# Patient Record
Sex: Female | Born: 1985 | Race: White | Hispanic: No | Marital: Married | Smoking: Never smoker
Health system: Southern US, Community
[De-identification: ages and names within clinical notes are randomized; demographics above are authoritative.]

## PROBLEM LIST (undated history)

## (undated) DIAGNOSIS — J45909 Unspecified asthma, uncomplicated: Secondary | ICD-10-CM

## (undated) HISTORY — DX: Unspecified asthma, uncomplicated: J45.909

---

## 2014-12-25 ENCOUNTER — Ambulatory Visit (INDEPENDENT_AMBULATORY_CARE_PROVIDER_SITE_OTHER): Payer: 59

## 2014-12-25 ENCOUNTER — Ambulatory Visit (INDEPENDENT_AMBULATORY_CARE_PROVIDER_SITE_OTHER): Payer: 59 | Admitting: Podiatrist

## 2014-12-25 ENCOUNTER — Encounter: Payer: Self-pay | Admitting: Podiatrist

## 2014-12-25 DIAGNOSIS — B079 Viral wart, unspecified: Secondary | ICD-10-CM

## 2014-12-25 DIAGNOSIS — R52 Pain, unspecified: Secondary | ICD-10-CM

## 2014-12-25 DIAGNOSIS — M722 Plantar fascial fibromatosis: Secondary | ICD-10-CM | POA: Diagnosis not present

## 2014-12-25 MED ORDER — CIMETIDINE 800 MG PO TABS: 800.0000 mg | ORAL_TABLET | Freq: Every day | ORAL | Status: DC

## 2014-12-25 NOTE — Patient Instructions (Signed)
Plantar Fasciitis (Heel Spur Syndrome) with Rehab The plantar fascia is a fibrous, ligament-like, soft-tissue structure that spans the bottom of the foot. Plantar fasciitis is a condition that causes pain in the foot due to inflammation of the tissue. SYMPTOMS   Pain and tenderness on the underneath side of the foot.  Pain that worsens with standing or walking. CAUSES  Plantar fasciitis is caused by irritation and injury to the plantar fascia on the underneath side of the foot. Common mechanisms of injury include:  Direct trauma to bottom of the foot.  Damage to a small nerve that runs under the foot where the main fascia attaches to the heel bone. Stress placed on the plantar fascia due to any mild increased activity or injury RISK INCREASES WITH:   Obesity.  Poor strength and flexibility.  Improperly fitted shoes.  Tight calf muscles.  Flat feet.  Failure to warm-up properly before activity.  PREVENTION  Warm up and stretch properly before activity.  Strength, flexibility  Maintain a health body weight.  Avoid stress on the plantar fascia.  Wear properly fitted shoes, including arch supports for individuals who have flat feet. PROGNOSIS  If treated properly, then the symptoms of plantar fasciitis usually resolve without surgery. However, occasionally surgery is necessary. RELATED COMPLICATIONS   Recurrent symptoms that may result in a chronic condition.  Problems of the lower back that are caused by compensating for the injury, such as limping.  Pain or weakness of the foot during push-off following surgery.  Chronic inflammation, scarring, and partial or complete fascia tear, occurring more often from repeated injections. TREATMENT  Treatment initially involves the use of ice and medication to help reduce pain and inflammation. The use of strengthening and stretching exercises may help reduce pain with activity, especially stretches of the Achilles tendon.  Your  caregiver may recommend that you use arch supports to help reduce stress on the plantar fascia. Often, corticosteroid injections are given to reduce inflammation. If symptoms persist for greater than 6 months despite non-surgical (conservative), then surgery may be recommended.  MEDICATION   If pain medication is necessary, then nonsteroidal anti-inflammatory medications, such as aspirin and ibuprofen, or other minor pain relievers, such as acetaminophen, are often recommended. Corticosteroid injections may be given by your caregiver.  HEAT AND COLD  Cold treatment (icing) relieves pain and reduces inflammation. Cold treatment should be applied for 10 to 15 minutes every 2 to 3 hours for inflammation and pain and immediately after any activity that aggravates your symptoms. Use ice packs or massage the area with a piece of ice (ice massage).  Heat treatment may be used prior to performing the stretching and strengthening activities prescribed by your caregiver, physical therapist, or athletic trainer. Use a heat pack or soak the injury in warm water. SEEK IMMEDIATE MEDICAL CARE IF:  Treatment seems to offer no benefit, or the condition worsens.  Any medications produce adverse side effects.  Perform this particular stretch daily first thing in the morning and before you go to bed. Hold for 30 seconds.    Try all the exercises and choose your favorite 3 to perform daily--  EXERCISES-- perform each exercise a total of 10-15 repetitions.  Hold for 30 seconds and perform 3 times per day   RANGE OF MOTION (ROM) AND STRETCHING EXERCISES - Plantar Fasciitis (Heel Spur Syndrome) These exercises may help you when beginning to rehabilitate your injury.   While completing these exercises, remember:   Restoring tissue flexibility helps normal  motion to return to the joints. This allows healthier, less painful movement and activity.  An effective stretch should be held for at least 30 seconds.  A  stretch should never be painful. You should only feel a gentle lengthening or release in the stretched tissue. RANGE OF MOTION - Toe Extension, Flexion  Sit with your right / left leg crossed over your opposite knee.  Grasp your toes and gently pull them back toward the top of your foot. You should feel a stretch on the bottom of your toes and/or foot.  Hold this stretch for __________ seconds.  Now, gently pull your toes toward the bottom of your foot. You should feel a stretch on the top of your toes and or foot.  Hold this stretch for __________ seconds. Repeat __________ times. Complete this stretch __________ times per day.  RANGE OF MOTION - Ankle Dorsiflexion, Active Assisted  Remove shoes and sit on a chair that is preferably not on a carpeted surface.  Place right / left foot under knee. Extend your opposite leg for support.  Keeping your heel down, slide your right / left foot back toward the chair until you feel a stretch at your ankle or calf. If you do not feel a stretch, slide your bottom forward to the edge of the chair, while still keeping your heel down.  Hold this stretch for __________ seconds. Repeat __________ times. Complete this stretch __________ times per day.  STRETCH  Gastroc, Standing  Place hands on wall.  Extend right / left leg, keeping the front knee somewhat bent.  Slightly point your toes inward on your back foot.  Keeping your right / left heel on the floor and your knee straight, shift your weight toward the wall, not allowing your back to arch.  You should feel a gentle stretch in the right / left calf. Hold this position for __________ seconds. Repeat __________ times. Complete this stretch __________ times per day. STRETCH  Soleus, Standing  Place hands on wall.  Extend right / left leg, keeping the other knee somewhat bent.  Slightly point your toes inward on your back foot.  Keep your right / left heel on the floor, bend your back  knee, and slightly shift your weight over the back leg so that you feel a gentle stretch deep in your back calf.  Hold this position for __________ seconds. Repeat __________ times. Complete this stretch __________ times per day. STRETCH  Gastrocsoleus, Standing  Note: This exercise can place a lot of stress on your foot and ankle. Please complete this exercise only if specifically instructed by your caregiver.   Place the ball of your right / left foot on a step, keeping your other foot firmly on the same step.  Hold on to the wall or a rail for balance.  Slowly lift your other foot, allowing your body weight to press your heel down over the edge of the step.  You should feel a stretch in your right / left calf.  Hold this position for __________ seconds.  Repeat this exercise with a slight bend in your right / left knee. Repeat __________ times. Complete this stretch __________ times per day.  STRENGTHENING EXERCISES - Plantar Fasciitis (Heel Spur Syndrome)  These exercises may help you when beginning to rehabilitate your injury. They may resolve your symptoms with or without further involvement from your physician, physical therapist or athletic trainer. While completing these exercises, remember:   Muscles can gain both the endurance  and the strength needed for everyday activities through controlled exercises.  Complete these exercises as instructed by your physician, physical therapist or athletic trainer. Progress the resistance and repetitions only as guided.   Plantar Warts Warts are benign (noncancerous) growths of the outer skin layer. They can occur at any time in life but are most common during childhood and the teen years. Warts can occur on many skin surfaces of the body. When they occur on the underside (sole) of your foot they are called plantar warts. They often emerge in groups with several small warts encircling a larger growth. CAUSES  Human papillomavirus (HPV) is  the cause of plantar warts. HPV attacks a break in the skin of the foot. Walking barefoot can lead to exposure to the wart virus. Plantar warts tend to develop over areas of pressure such as the heel and ball of the foot. Plantar warts often grow into the deeper layers of skin. They may spread to other areas of the sole but cannot spread to other areas of the body. SYMPTOMS  You may also notice a growth on the undersurface of your foot. The wart may grow directly into the sole of the foot, or rise above the surface of the skin on the sole of the foot, or both. They are most often flat from pressure. Warts generally do not cause itching but may cause pain in the area of the wart when you put weight on your foot. DIAGNOSIS  Diagnosis is made by physical examination. This means your caregiver discovers it while examining your foot.  TREATMENT  There are many ways to treat plantar warts. However, warts are very tough. Sometimes it is difficult to treat them so that they go away completely and do not grow back. Any treatment must be done regularly to work. If left untreated, most plantar warts will eventually disappear over a period of one to two years. Treatments you can do at home include:  Putting duct tape over the top of the wart (occlusion) has been found to be effective over several months. The duct tape should be removed each night and reapplied until the wart has disappeared.  Placing over-the-counter medications on top of the wart to help kill the wart virus and remove the wart tissue (salicylic acid, cantharidin, and dichloroacetic acid) are useful. These are called keratolytic agents. These medications make the skin soft and gradually layers will shed away. These compounds are usually placed on the wart each night and then covered with a bandage. They are also available in premedicated bandage form. Avoid surrounding skin when applying these liquids as these medications can burn healthy skin. The  treatment may take several months of nightly use to be effective.  Cryotherapy to freeze the wart has recently become available over-the-counter for children 4 years and older. This system makes use of a soft narrow applicator connected to a bottle of compressed cold liquid that is applied directly to the wart. This medication can burn healthy skin and should be used with caution.  As with all over-the-counter medications, read the directions carefully before use. Treatments generally done in your caregiver's office include:  Some aggressive treatments may cause discomfort, discoloration, and scarring of the surrounding skin. The risks and benefits of treatment should be discussed with your caregiver.  Freezing the wart with liquid nitrogen (cryotherapy, see above).  Burning the wart with use of very high heat (cautery).  Injecting medication into the wart.  Surgically removing or laser treatment of the  wart.  Your caregiver may refer you to a dermatologist for difficult to treat large-sized warts or large numbers of warts. HOME CARE INSTRUCTIONS   Soak the affected area in warm water. Dry the area completely when you are done. Remove the top layer of softened skin, then apply the chosen topical medication and reapply a bandage.  Remove the bandage daily and file excess wart tissue (pumice stone works well for this purpose). Repeat the entire process daily or every other day for weeks until the plantar wart disappears.  Several brands of salicylic acid pads are available as over-the-counter remedies.  Pain can be relieved by wearing a donut bandage. This is a bandage with a hole in it. The bandage is put on with the hole over the wart. This helps take the pressure off the wart and gives pain relief. To help prevent plantar warts:  Wear shoes and socks and change them daily.  Keep feet clean and dry.  Check your feet and your children's feet regularly.  Avoid direct contact with  warts on other people.  Have growths or changes on your skin checked by your caregiver. Document Released: 10/30/2003 Document Revised: 12/24/2013 Document Reviewed: 04/09/2009 [facility] Patient Information 2015 [place], [place]. This information is not intended to replace advice given to you by your health care provider. Make sure you discuss any questions you have with your health care provider.

## 2014-12-25 NOTE — Progress Notes (Signed)
   Subjective:    Patient ID: Tiffany PloverRachel Valente, female    DOB: 03/16/1986, 29 y.o.   MRN: [id number]  HPI  LT FOOT BALL OF THE FOOT HAVE WARTS FOR 5 YEARS. THE WARTS IS GETTING BIGGER AND SPREADING. THE FOOT GET AGGRAVATED BY PRESSURE/WALKING. TRIED OTC WART TREATMENT BUT NO HELP.  ALSO, LT FOOT HEEL PAIN.  Review of Systems  Skin: Positive for color change.       Objective:   Physical Exam  Patient is awake, alert, and oriented x 3.  In no acute distress.  Vascular status is intact with palpable pedal pulses at 2/4 DP and PT bilateral and capillary refill time within normal limits. Neurological sensation is also intact bilaterally via Semmes Weinstein monofilament at 5/5 sites. Light touch, vibratory sensation, Achilles tendon reflex is intact. Dermatological exam reveals skin color, turger and texture as normal. No open lesions present.  Musculature intact with dorsiflexion, plantarflexion, inversion, eversion.  Pain on palpation plantar medial aspect of the left heel is noted consistent with plantar fasciitis symptomatology. X-rays are negative for any sign of heel spur. No fracture is seen. Thickening of the plantar fascia is noted.  A large wart like lesion is presence of metatarsal 1 of the left foot measures 3 cm x 2 cm and is flat with multiple capillary budding noted throughout. It is in a area of bony prominence the metatarsal 1 left foot      Assessment & Plan:  Plantar fascitis left, wart left foot  Injected the left foot with Kenalog and Marcaine mixture without complication. Recommended topical salicylic acid as well as Tagamet oral medication for the wart. In the future she may be candidate for excision and curettage with CO2 laser ablation. She'll be seen back for recheck.

## 2015-01-22 ENCOUNTER — Ambulatory Visit (INDEPENDENT_AMBULATORY_CARE_PROVIDER_SITE_OTHER): Payer: 59 | Admitting: Podiatry

## 2015-01-22 ENCOUNTER — Encounter: Payer: Self-pay | Admitting: Podiatry

## 2015-01-22 VITALS — BP 148/89 | HR 60 | Resp 15

## 2015-01-22 DIAGNOSIS — M722 Plantar fascial fibromatosis: Secondary | ICD-10-CM | POA: Diagnosis not present

## 2015-01-22 DIAGNOSIS — B079 Viral wart, unspecified: Secondary | ICD-10-CM | POA: Diagnosis not present

## 2015-01-22 NOTE — Progress Notes (Signed)
Subjective:     Patient ID: Tiffany Warner, female   DOB: 05/13/1986, 29 y.o.   MRN: [id number]  HPI patient presents stating that she still has discomfort in her left heel and she no she has a very thin heel. [place] and was concerned about the pain. Patient is also noted to have keratotic lesion formation   Review of Systems     Objective:   Physical Exam Neurovascular status intact muscle strength was adequate range of motion within normal limits. Patient's found to have discomfort in the plantar heel left and a keratotic lesion left that upon bleeding shows pinpoint pain    Assessment:     Plantar fasciitis left with verruca plantaris    Plan:     Reviewed both conditions and went ahead and scanned for custom orthotics to reduce plantar pressure on the foot and debrided lesion and applied chemical to create an immune response with sterile dressing. Reappoint for us to recheck in 4 weeks to dispense orthotics and see how she's done with the wart

## 2015-02-03 ENCOUNTER — Other Ambulatory Visit (INDEPENDENT_AMBULATORY_CARE_PROVIDER_SITE_OTHER): Payer: Managed Care, Other (non HMO)

## 2015-02-03 ENCOUNTER — Encounter: Payer: Self-pay | Admitting: Internal Medicine

## 2015-02-03 ENCOUNTER — Ambulatory Visit (INDEPENDENT_AMBULATORY_CARE_PROVIDER_SITE_OTHER): Payer: Managed Care, Other (non HMO) | Admitting: Internal Medicine

## 2015-02-03 VITALS — BP 112/72 | HR 61 | Ht 64.0 in | Wt 129.4 lb

## 2015-02-03 DIAGNOSIS — J4520 Mild intermittent asthma, uncomplicated: Secondary | ICD-10-CM

## 2015-02-03 LAB — CBC WITH DIFFERENTIAL/PLATELET
BASOS PCT: 0.5 % (ref 0.0–3.0)
Basophils Absolute: 0 10*3/uL (ref 0.0–0.1)
EOS ABS: 0.2 10*3/uL (ref 0.0–0.7)
EOS PCT: 3.3 % (ref 0.0–5.0)
HEMATOCRIT: 45.5 % (ref 36.0–46.0)
HEMOGLOBIN: 15.4 g/dL — AB (ref 12.0–15.0)
LYMPHS ABS: 2 10*3/uL (ref 0.7–4.0)
Lymphocytes Relative: 37.8 % (ref 12.0–46.0)
MCHC: 33.8 g/dL (ref 30.0–36.0)
MCV: 90.2 fl (ref 78.0–100.0)
MONO ABS: 0.5 10*3/uL (ref 0.1–1.0)
MONOS PCT: 8.6 % (ref 3.0–12.0)
NEUTROS ABS: 2.6 10*3/uL (ref 1.4–7.7)
Neutrophils Relative %: 49.8 % (ref 43.0–77.0)
PLATELETS: 205 10*3/uL (ref 150.0–400.0)
RBC: 5.04 Mil/uL (ref 3.87–5.11)
RDW: 12.8 % (ref 11.5–15.5)
WBC: 5.3 10*3/uL (ref 4.0–10.5)

## 2015-02-03 MED ORDER — ALBUTEROL SULFATE HFA 108 (90 BASE) MCG/ACT IN AERS: 2.0000 | INHALATION_SPRAY | Freq: Four times a day (QID) | RESPIRATORY_TRACT | Status: AC | PRN

## 2015-02-03 MED ORDER — MONTELUKAST SODIUM 10 MG PO TABS: 10.0000 mg | ORAL_TABLET | Freq: Every day | ORAL | Status: AC

## 2015-02-03 NOTE — Patient Instructions (Addendum)
Please remember to go to the lab department downstairs for your tests - we will call you with the results when they are available.  Try singulair 10 mg each pm    If not better with the night time syptoms try pepcid ac 20 mg at bedtime  GERD (REFLUX)  is an extremely common cause of respiratory symptoms just like yours , many times with no obvious heartburn at all.    It can be treated with medication, but also with lifestyle changes including avoidance of late meals, elevation of the head of your bed (ideally with 6 inch  bed blocks) excessive alcohol, smoking cessation, and avoid fatty foods, chocolate, peppermint, colas, red wine, and acidic juices such as orange juice.  NO MINT OR MENTHOL PRODUCTS SO NO COUGH DROPS  USE SUGARLESS CANDY INSTEAD (Jolley ranchers or Stover's or Life Savers) or even ice chips will also do - the key is to swallow to prevent all throat clearing. NO OIL BASED VITAMINS - use powdered substitutes.  If not 100% better and need albuterol more than twice a week return with albuterol inhaler

## 2015-02-03 NOTE — Progress Notes (Signed)
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 Pulmonary Consult    Self referral. Pt states dxed with asthma in 2000.  She c/o chest tightness, SOB, cough, and wheezing for the past 3 months.  Cough is non prod. Symptoms are worse when exposed to extreme cold, stress, heat and exertion. She is using albuterol at least 1 x per wk.   has had episodes of extreme chest tightness/ throat closing up / never tried singulair,   Sometimes gets better with a few deep breaths/ other times with saba Sneezing / itching spring > fall seems trigger the asthma also  Wakes up 5 x per month with cough / better with water or sit up/ take deep breaths/ sometimes assoc with chest tightness  Needs saba also with ex  No obvious other patterns in day to day or daytime variabilty or assoc cp or   overt sinus or hb symptoms. No unusual exp hx or h/o childhood pna/ asthma or knowledge of premature birth.   Also denies any obvious fluctuation of symptoms with weather or environmental changes or other aggravating or alleviating factors except as outlined above   Current Medications, Allergies, Complete Past Medical History, Past Surgical History, Family History, and Social History were reviewed in [employer] record.            Review of Systems  Constitutional: Negative for fever, chills and unexpected weight change.  HENT: Positive for sinus pressure. Negative for congestion, dental problem,  ear pain, nosebleeds, postnasal drip, rhinorrhea, sneezing, sore throat, trouble swallowing and voice change.   Eyes: Negative for visual disturbance.  Respiratory: Positive for cough and shortness of breath. Negative for choking.   Cardiovascular: Negative for chest pain and leg swelling.  Gastrointestinal: Negative for vomiting, abdominal pain and diarrhea.  Genitourinary: Negative for difficulty urinating.  Musculoskeletal: Negative for arthralgias.  Skin: Negative for rash.  Neurological: Negative for tremors, syncope and headaches.  Hematological: Does not bruise/bleed easily.       Objective:   Physical Exam  amb wf nad  Wt Readings from Last 3 Encounters:  02/03/15 129 lb 6.4 oz (58.695 kg)    Vital signs reviewed    HEENT: nl dentition, turbinates, and orophanx. Nl external ear canals without cough reflex   NECK :  without JVD/Nodes/TM/ nl carotid upstrokes bilaterally   LUNGS: no acc muscle use, clear to A and P bilaterally without cough on insp or exp maneuvers   CV:  RRR  no s3 or murmur or increase in P2, no edema   ABD:  soft and nontender with nl excursion in the supine position. No bruits or organomegaly, bowel sounds nl  MS:  warm without deformities, calf tenderness, cyanosis or clubbing  SKIN: warm and dry without lesions    NEURO:  alert, approp, no deficits   Tests ordered 02/03/2015 allergy profile.  Assessment & Plan:

## 2015-02-04 ENCOUNTER — Encounter: Payer: Self-pay | Admitting: Internal Medicine

## 2015-02-04 LAB — ALLERGY FULL PROFILE
Alternaria Alternata: <0.1 kU/L
Aspergillus fumigatus, m3: <0.1 kU/L
BOX ELDER: 0.33 kU/L — AB
Bahia Grass: 47.8 kU/L — ABNORMAL HIGH
Bermuda Grass: 25.2 kU/L — ABNORMAL HIGH
Candida Albicans: <0.1 kU/L
Cat Dander: 0.23 kU/L — ABNORMAL HIGH
Common Ragweed: 0.4 kU/L — ABNORMAL HIGH
D. farinae: 0.11 kU/L — ABNORMAL HIGH
DOG DANDER: 8.37 kU/L — AB
Elm IgE: 0.29 kU/L — ABNORMAL HIGH
FESCUE: 83.5 kU/L — AB
G005 RYE, PERENNIAL: 85.5 kU/L — AB
G009 Red Top: 84.4 kU/L — ABNORMAL HIGH
GOLDENROD: 0.29 kU/L — AB
House Dust Hollister: 1.03 kU/L — ABNORMAL HIGH
IGE (IMMUNOGLOBULIN E), SERUM: 207 kU/L — AB (ref <115)
Lamb's Quarters: 0.54 kU/L — ABNORMAL HIGH
OAK CLASS: 0.29 kU/L — AB
Plantain: 0.42 kU/L — ABNORMAL HIGH
Sycamore Tree: 0.33 kU/L — ABNORMAL HIGH
TIMOTHY GRASS: 73.4 kU/L — AB

## 2015-02-04 NOTE — Assessment & Plan Note (Signed)
Not really clear this is asthma as worse with advair suggesting uacs/ vcd and better sometimes with deep breathing suggestive of panic disorder.  Waking from a sound sleep though with episodes of coughing certainly suggests asthma but also could represent LPR in pt on bcps  For now rec trial of singulair to see if helps reduce spells and saba use / rule of 2's reviewed in detail  Total time counseling - 25 min

## 2015-02-05 ENCOUNTER — Telehealth: Payer: Self-pay | Admitting: Internal Medicine

## 2015-02-05 NOTE — Telephone Encounter (Signed)
Notes Recorded by Nyoka Cowden, MD on 02/04/2015 at 5:14 PM Call patient : Studies are c/w allergies to grass/ trees dogs> cat but no change in recs for now > let's see how she does on singulair first --  I spoke with patient about results and she verbalized understanding and had no questions

## 2015-02-05 NOTE — Progress Notes (Signed)
Quick Note:  LVM for pt to return call ______ 

## 2015-02-28 ENCOUNTER — Ambulatory Visit: Payer: 59 | Admitting: Podiatry

## 2015-03-03 ENCOUNTER — Ambulatory Visit (INDEPENDENT_AMBULATORY_CARE_PROVIDER_SITE_OTHER): Payer: 59 | Admitting: Podiatry

## 2015-03-03 ENCOUNTER — Encounter: Payer: Self-pay | Admitting: Podiatry

## 2015-03-03 VITALS — BP 118/73 | HR 55 | Resp 12

## 2015-03-03 DIAGNOSIS — B079 Viral wart, unspecified: Secondary | ICD-10-CM

## 2015-03-03 DIAGNOSIS — M722 Plantar fascial fibromatosis: Secondary | ICD-10-CM

## 2015-03-03 NOTE — Progress Notes (Signed)
   Subjective:    Patient ID: Tiffany PloverRachel Warner, female    DOB: 05/15/1986, 29 y.o.   MRN: [id number]  HPI "They're okay.  I'm here for orthotics."    Review of Systems     Objective:   Physical Exam        Assessment & Plan:

## 2015-03-03 NOTE — Patient Instructions (Signed)

## 2015-03-04 NOTE — Progress Notes (Signed)
Subjective:     Patient ID: Tiffany PloverRachel Dikes, female   DOB: 12/22/1985, 29 y.o.   MRN: [id number]  HPI patient presents stating overall I'm doing pretty well with my heel feeling quite a bit better and the wart still present but improving   Review of Systems     Objective:   Physical Exam Neurovascular status intact with moderate depression of the arch still noted and heel pain left with inflammation around the medial band that's improving but still present along with small keratotic lesion left which appears to be getting smaller    Assessment:     Tendinitis present left with plantar fasciitis and improvement of verruca plantaris left    Plan:     Orthotics dispensed with instructions and instructed on physical therapy supportive shoe gear usage and reappoint to recheck

## 2015-05-16 ENCOUNTER — Encounter: Payer: Self-pay | Admitting: Family Medicine

## 2015-05-16 ENCOUNTER — Ambulatory Visit (INDEPENDENT_AMBULATORY_CARE_PROVIDER_SITE_OTHER): Payer: BLUE CROSS/BLUE SHIELD | Admitting: Family Medicine

## 2015-05-16 VITALS — BP 125/84 | HR 57 | Ht 64.0 in | Wt 128.0 lb

## 2015-05-16 DIAGNOSIS — M5441 Lumbago with sciatica, right side: Secondary | ICD-10-CM

## 2015-05-16 MED ORDER — PREDNISONE 10 MG PO TABS: ORAL_TABLET | ORAL | Status: AC

## 2015-05-16 NOTE — Patient Instructions (Signed)
Your history and exam suggests either radiculopathy (irritated nerve from disc bulge in your back) or an unusual presentation of a stress fracture. Start with prednisone dose pack for 6 days. Call me in a week to let me know how you're doing. If not improving I'd recommend going ahead with x-rays of low back and hip/pelvis, MRI of hip/pelvis. No running in meantime until we see how you're doing in a week.

## 2015-05-19 ENCOUNTER — Telehealth: Payer: Self-pay | Admitting: Family Medicine

## 2015-05-19 NOTE — Telephone Encounter (Signed)
I would rest first for about a week.  Then if she's improving AND the bike/swimming do not make pain worse, she can do these.  Thanks!

## 2015-05-19 NOTE — Telephone Encounter (Signed)
Spoke to patient and gave her the information provided by the physician.  

## 2015-05-21 DIAGNOSIS — M545 Low back pain, unspecified: Secondary | ICD-10-CM | POA: Insufficient documentation

## 2015-05-21 NOTE — Assessment & Plan Note (Signed)
unusual presentation suggesting radiculopathy or an unusual presentation of a stress fracture.  No acute injury to cause a disc herniation but distribution with numbness into the foot would be consistent with a bulge causing radiculopathy.  Advised we start with prednisone dose pack for about a week and call us to let us know how she's doing.  If still not improving woulc go ahead with radiographs of low back, right hip/pelvis then MRI of hip/pelvis.  No running in meantime.

## 2015-05-21 NOTE — Progress Notes (Addendum)
PCP: Pcp Not In System  Subjective:   HPI: Patient is a 29 y.o. female here for right hip, leg pain.  Patient reports she has been training for a 50k. States for about 3 weeks she's had unusual burning sensation in right hip. Progressed that on Saturday couldn't move her right leg. No known injury or trauma. Pain radiates from posterior hip into groin, leg, down to knee. No benefit with aleve. Associated tingling, numbness in foot. Nothing similar in past.  Past Medical History  Diagnosis Date  . Asthma     Current Outpatient Prescriptions on File Prior to Visit  Medication Sig Dispense Refill  . albuterol (PROAIR HFA) 108 (90 BASE) MCG/ACT inhaler Inhale 2 puffs into the lungs every 6 (six) hours as needed for wheezing or shortness of breath. 1 Inhaler 1  . LO LOESTRIN FE 1 MG-10 MCG / 10 MCG tablet   5  . montelukast (SINGULAIR) 10 MG tablet Take 1 tablet (10 mg total) by mouth at bedtime. 30 tablet 11   No current facility-administered medications on file prior to visit.    No past surgical history on file.  Allergies  Allergen Reactions  . Advair Diskus [Fluticasone-Salmeterol]     Severe HA  . Pneumococcal Vaccines     Local reaction- swelling and "blotches"    Social History   Social History  . Marital Status: Single    Spouse Name: N/A  . Number of Children: N/A  . Years of Education: N/A   Occupational History  . speech therapist    Social History Main Topics  . Smoking status: Never Smoker   . Smokeless tobacco: Never Used  . Alcohol Use: 0.0 oz/week    0 Standard drinks or equivalent per week     Comment: every once in a while  . Drug Use: No  . Sexual Activity: Not on file   Other Topics Concern  . Not on file   Social History Narrative    Family History  Problem Relation Age of Onset  . Emphysema Maternal Grandfather     smoked  . Lung cancer Maternal Grandfather     smoked  . Heart attack Maternal Grandfather   . Emphysema Paternal  Uncle     smoked  . Lung cancer Paternal Uncle     smoked  . Allergies Brother     BP 125/84 mmHg  Pulse 57  Ht  (1.626 m)  Wt 128 lb (58.06 kg)  BMI 21.96 kg/m2  Review of Systems: See HPI above.    Objective:  Physical Exam:  Gen: NAD  Back/right hip: No gross deformity, scoliosis. No focal TTP.  No midline or bony TTP. FROM hip with negative logroll. Strength LEs 5/5 all muscle groups.   2+ MSRs in patellar and achilles tendons, equal bilaterally. Negative SLRs. Sensation intact to light touch bilaterally. Negative logroll bilateral hips Negative fulcrum, hop. Negative fabers and piriformis stretches.    Assessment & Plan:  1. Back/right hip pain - unusual presentation suggesting radiculopathy or an unusual presentation of a stress fracture.  No acute injury to cause a disc herniation but distribution with numbness into the foot would be consistent with a bulge causing radiculopathy.  Advised we start with prednisone dose pack for about a week and call us to let us know how she's doing.  If still not improving woulc go ahead with radiographs of low back, right hip/pelvis then MRI of hip/pelvis.  No running in meantime.  Addendum:  Patient called and is not improved (see phone notes).  Radiographs negative.  Will go ahead with right hip/pelvis MRI without contrast to assess for stress fracture.  Addendum:  MRI reviewed and discussed with patient.  Advised her MRI is normal without evidence of stress fracture.  Discussed consideration of physical therapy.  MRI of lumbar spine is a consideration as well but unlikely to help unless she wishes to move forward with ESI.  She would like to start with some home exercises, feels some improvement since we last talked.  If not improving over next couple weeks she will call us to start physical therapy for radiculopathy.

## 2015-05-23 ENCOUNTER — Telehealth: Payer: Self-pay | Admitting: Family Medicine

## 2015-05-23 ENCOUNTER — Ambulatory Visit (HOSPITAL_BASED_OUTPATIENT_CLINIC_OR_DEPARTMENT_OTHER)
Admission: RE | Admit: 2015-05-23 | Discharge: 2015-05-23 | Disposition: A | Discharge status: Home / Self Care | Payer: BLUE CROSS/BLUE SHIELD | Source: Ambulatory Visit | Attending: Family Medicine | Admitting: Family Medicine

## 2015-05-23 DIAGNOSIS — M545 Low back pain: Secondary | ICD-10-CM | POA: Insufficient documentation

## 2015-05-23 DIAGNOSIS — M5441 Lumbago with sciatica, right side: Secondary | ICD-10-CM

## 2015-05-23 NOTE — Telephone Encounter (Signed)
Left message. Orders placed for x-rays.

## 2015-05-27 NOTE — Addendum Note (Signed)
Addended by: Kathi Simpers F on: 05/27/2015 01:18 PM   Modules accepted: Orders

## 2015-06-09 ENCOUNTER — Ambulatory Visit
Admission: RE | Admit: 2015-06-09 | Discharge: 2015-06-09 | Disposition: A | Discharge status: Home / Self Care | Payer: BLUE CROSS/BLUE SHIELD | Source: Ambulatory Visit | Attending: Family Medicine | Admitting: Family Medicine

## 2015-06-09 DIAGNOSIS — M5441 Lumbago with sciatica, right side: Secondary | ICD-10-CM

## 2016-07-04 IMAGING — CR DG LUMBAR SPINE 2-3V
3 series · 3 of 3 positions shown · non-contrast
Comparison: None.

CLINICAL DATA: Right low back pain for 1 month.

EXAM:
LUMBAR SPINE - 2-3 VIEW

[t l-spine a.p.]
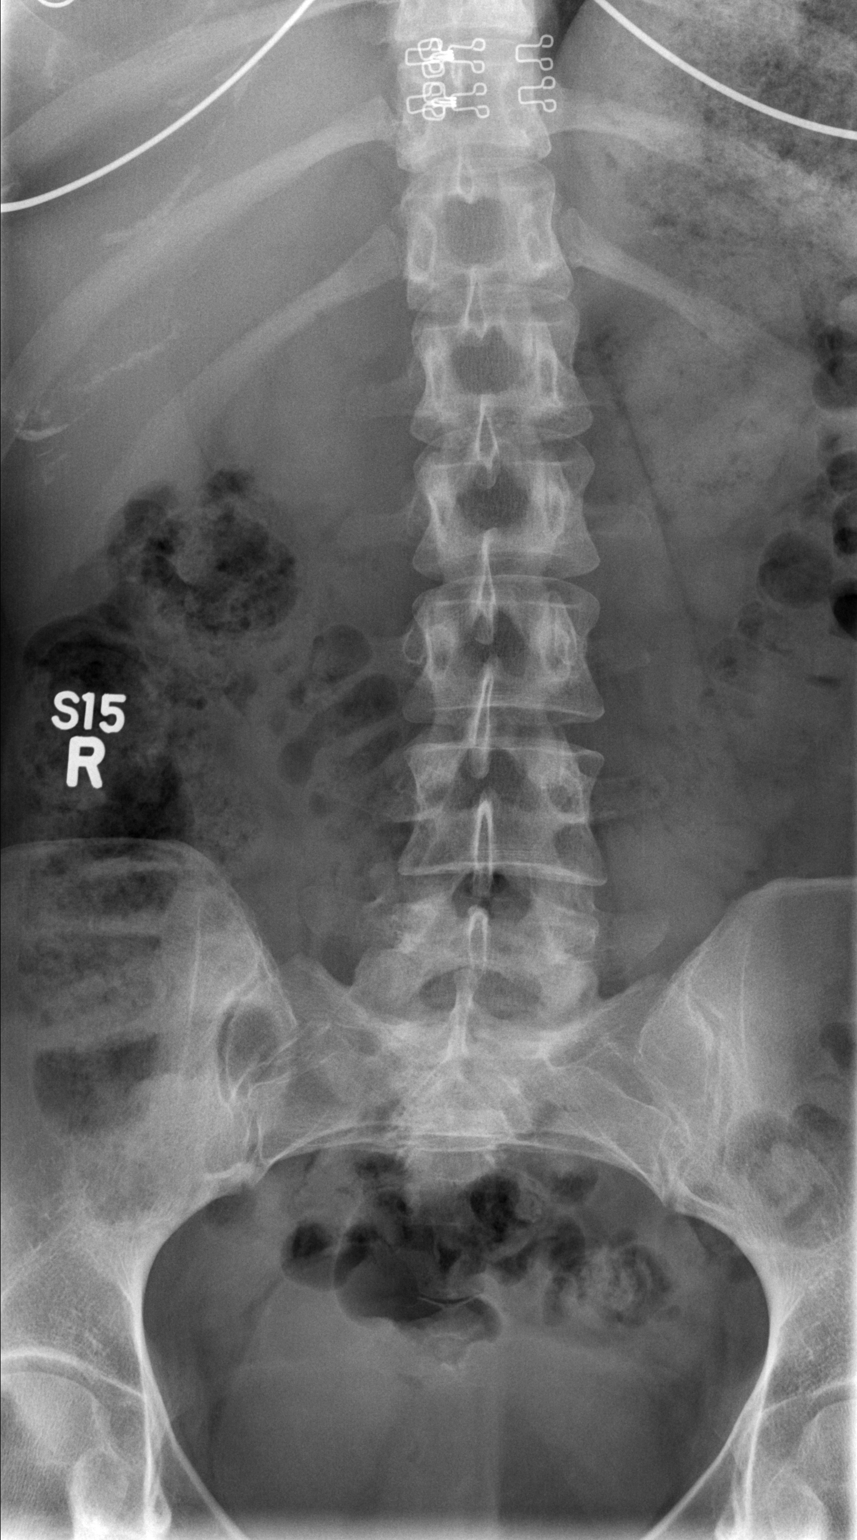

[t l-spine lat]
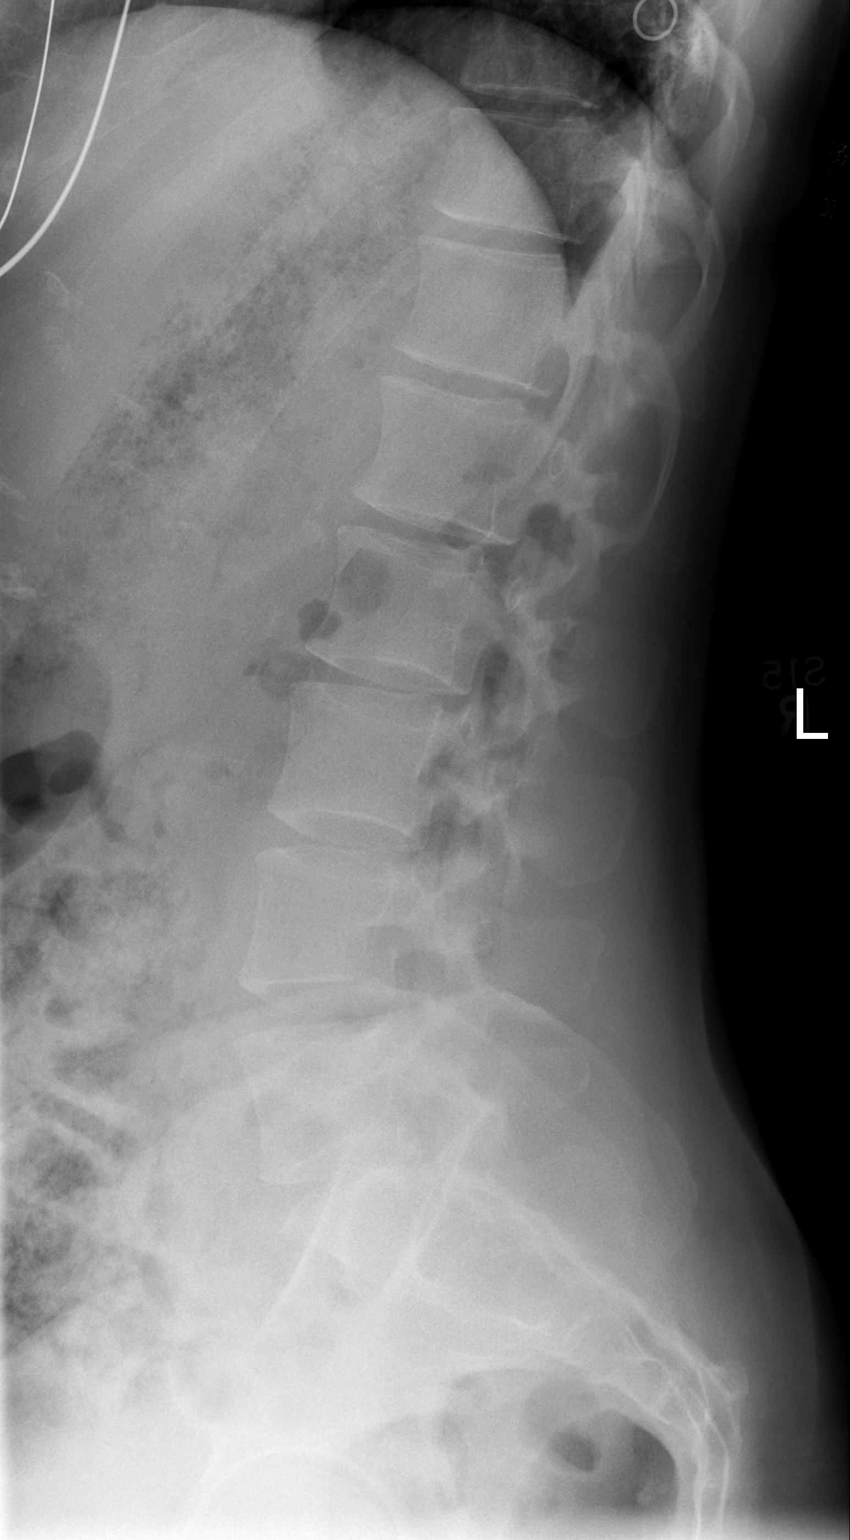

[t l-spine l5-s1 spot]
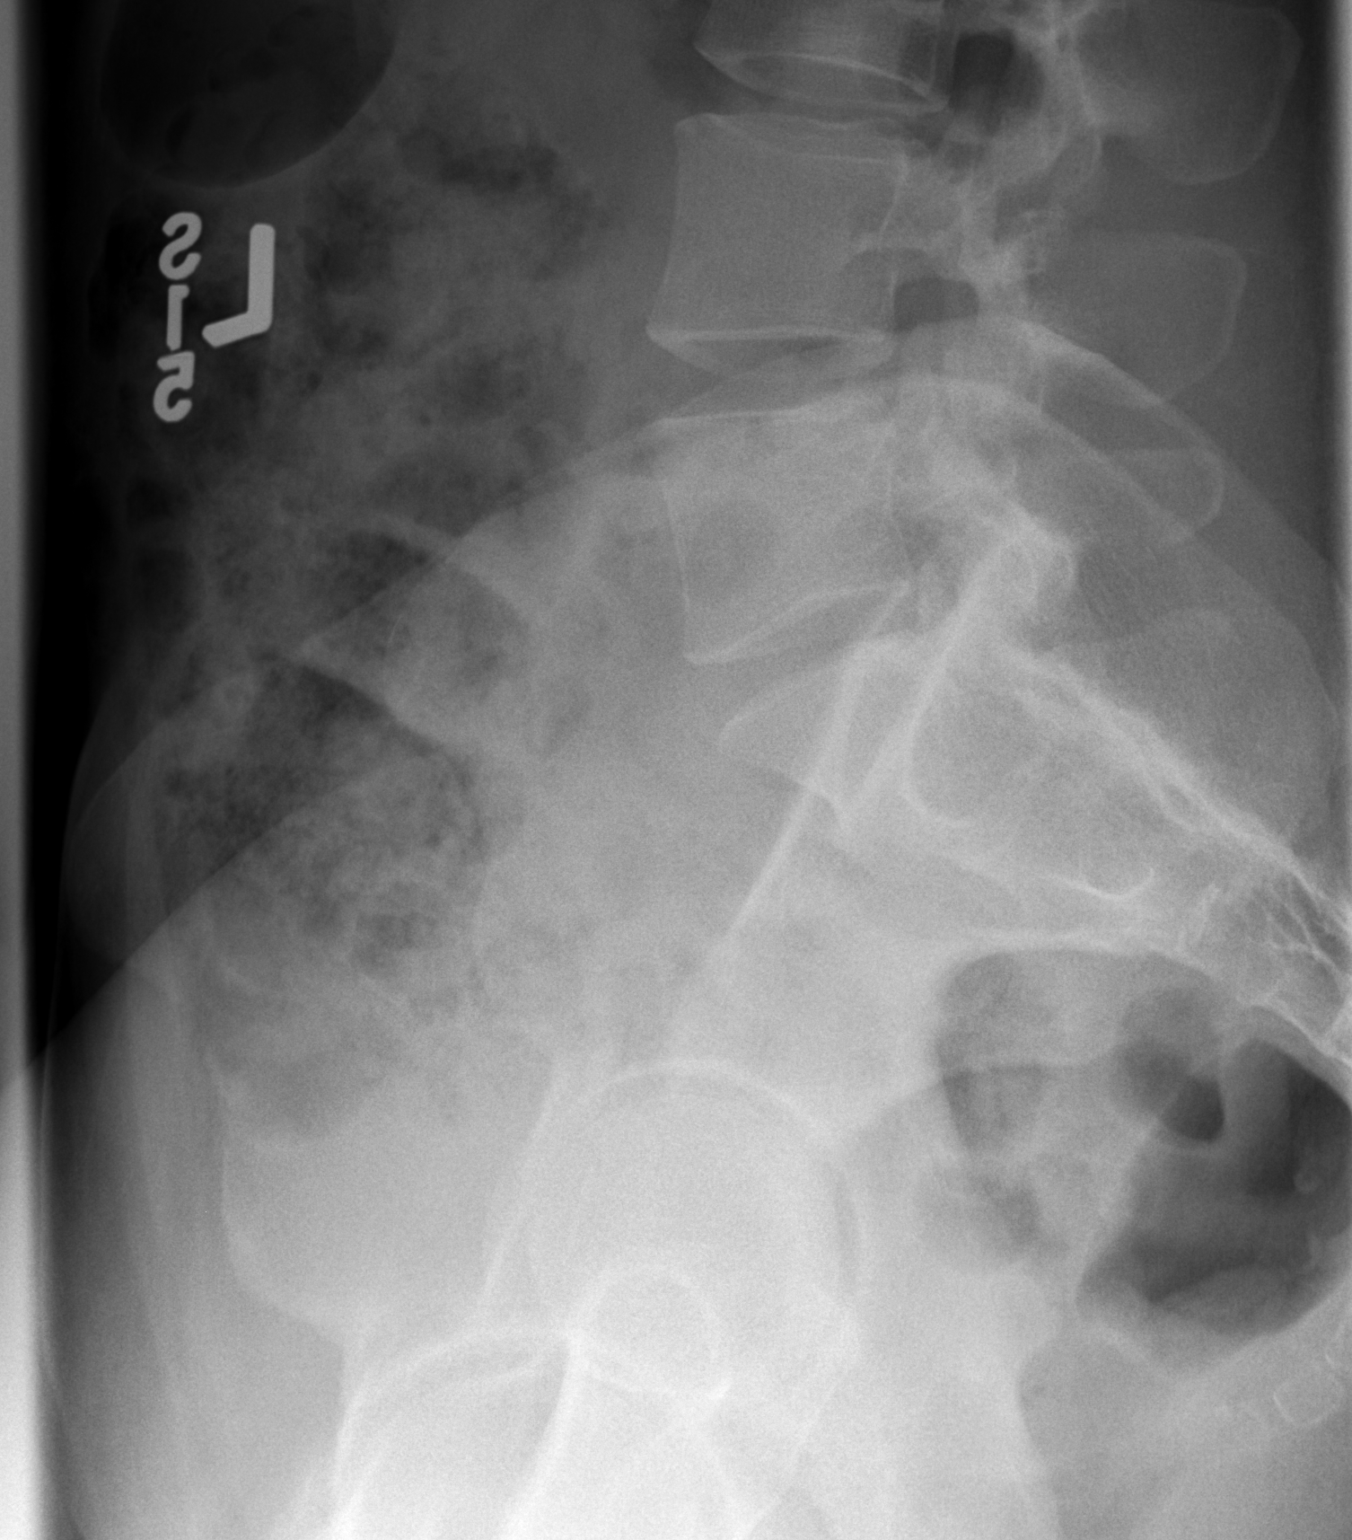

[3 of 3 positions shown; findings below may reference images not displayed]

FINDINGS: Five non rib-bearing lumbar type vertebral bodies are present.
Vertebral body heights and alignment are maintained. Slight leftward
curvature of the lumbar spine is centered at L4-5.
IMPRESSION: No acute abnormality.
